# Patient Record
Sex: Male | Born: 1980 | Race: White | Hispanic: No | Smoking: Never smoker
Health system: Southern US, Community
[De-identification: ages and names within clinical notes are randomized; demographics above are authoritative.]

---

## 2015-01-21 ENCOUNTER — Ambulatory Visit (INDEPENDENT_AMBULATORY_CARE_PROVIDER_SITE_OTHER): Payer: PRIVATE HEALTH INSURANCE | Admitting: Family Medicine

## 2015-01-21 ENCOUNTER — Encounter: Payer: Self-pay | Admitting: Family Medicine

## 2015-01-21 VITALS — BP 110/80 | Ht 72.0 in | Wt 175.0 lb

## 2015-01-21 DIAGNOSIS — M76821 Posterior tibial tendinitis, right leg: Secondary | ICD-10-CM | POA: Diagnosis not present

## 2015-01-21 DIAGNOSIS — M25512 Pain in left shoulder: Secondary | ICD-10-CM

## 2015-01-21 DIAGNOSIS — G8929 Other chronic pain: Secondary | ICD-10-CM | POA: Diagnosis not present

## 2015-01-21 DIAGNOSIS — M76829 Posterior tibial tendinitis, unspecified leg: Secondary | ICD-10-CM | POA: Insufficient documentation

## 2015-01-21 NOTE — Assessment & Plan Note (Signed)
Vague anterior pain that does not awaken him at night. Positive labral testing and history of this as well. -Concern for underlying labral issue. -Discussed merits of repeat MRA which is 88% sensitive versus only 75% sensitive for MRI. He is considered operative management this time which feel this would be a waste of money and time. -Recommend doing rotator cuff strengthening and avoiding overhead exercises/eccentric loading of the biceps. If this does not work we can try an intra-articular injection versus eventually doing a MRA if he is willing to have surgery.
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
## 2015-01-21 NOTE — Assessment & Plan Note (Signed)
Secondary to osteophyte off the medial malleolus with compression in that region. Discussed possible options including activity modification, orthotics, medial heel wedge, nitroglycerin patch, arch supports, topical versus systemic NSAIDs, compression. -We'll start with compression and can progress. He'll let us know he does.

## 2015-04-06 ENCOUNTER — Telehealth: Payer: Self-pay | Admitting: Family Medicine

## 2015-04-06 ENCOUNTER — Other Ambulatory Visit: Payer: Self-pay | Admitting: *Deleted

## 2015-04-06 DIAGNOSIS — G8929 Other chronic pain: Secondary | ICD-10-CM

## 2015-04-06 DIAGNOSIS — M25512 Pain in left shoulder: Principal | ICD-10-CM

## 2015-04-06 NOTE — Telephone Encounter (Signed)
I discussed with the pt about his ongoing L shoulder dilemma.  He has having severe disabling L shoulder pain preventing him from performing ADL's at this point and is waking him up at night.  Due to the ongoing shoulder pain and severity, we will proceed with an MRA/MRI of the L shoulder to evaluate possible etiologies.  Conservative management to this point including physical therapy and medications has been recalcitrant.    Thanks,  Twana First. [facility] sports medicine fellow

## 2015-04-06 NOTE — Addendum Note (Signed)
Addended by: Annita Brod on: 04/06/2015 10:21 AM   Modules accepted: Orders

## 2015-04-07 NOTE — Telephone Encounter (Signed)
MRI approval number is [id number]

## 2015-04-23 ENCOUNTER — Ambulatory Visit
Admission: RE | Admit: 2015-04-23 | Discharge: 2015-04-23 | Disposition: A | Discharge status: Home / Self Care | Payer: PRIVATE HEALTH INSURANCE | Source: Ambulatory Visit | Attending: Family Medicine | Admitting: Family Medicine

## 2015-04-23 DIAGNOSIS — M25512 Pain in left shoulder: Principal | ICD-10-CM

## 2015-04-23 DIAGNOSIS — G8929 Other chronic pain: Secondary | ICD-10-CM

## 2015-04-23 MED ORDER — IOHEXOL 180 MG/ML  SOLN
10.0000 mL | Freq: Once | INTRAMUSCULAR | Status: AC | PRN
  Administered 2015-04-23: 10 mL via INTRA_ARTICULAR

## 2015-04-25 ENCOUNTER — Telehealth: Payer: Self-pay | Admitting: Family Medicine

## 2015-04-25 NOTE — Telephone Encounter (Signed)
Discussed results with pt.  Will refer to Dr. Dion SaucierLandau for discussion of possible PT/injection vs surgical debridement with biceps tenodesis.  Thanks, Twana FirstBryan R. Mileidy Atkin, DO of [facility]   04/25/2015, 12:18 PM

## 2015-04-30 ENCOUNTER — Other Ambulatory Visit: Payer: Self-pay | Admitting: *Deleted

## 2015-04-30 DIAGNOSIS — G8929 Other chronic pain: Secondary | ICD-10-CM

## 2015-04-30 DIAGNOSIS — M25512 Pain in left shoulder: Principal | ICD-10-CM

## 2015-07-27 ENCOUNTER — Ambulatory Visit (INDEPENDENT_AMBULATORY_CARE_PROVIDER_SITE_OTHER): Payer: PRIVATE HEALTH INSURANCE | Admitting: Sports Medicine

## 2015-07-27 ENCOUNTER — Encounter: Payer: Self-pay | Admitting: Sports Medicine

## 2015-07-27 VITALS — BP 116/76 | HR 58 | Resp 16 | Wt 183.0 lb

## 2015-07-27 DIAGNOSIS — M5412 Radiculopathy, cervical region: Secondary | ICD-10-CM

## 2015-07-27 DIAGNOSIS — G8929 Other chronic pain: Secondary | ICD-10-CM | POA: Diagnosis not present

## 2015-07-27 DIAGNOSIS — Z0000 Encounter for general adult medical examination without abnormal findings: Secondary | ICD-10-CM | POA: Insufficient documentation

## 2015-07-27 DIAGNOSIS — M25512 Pain in left shoulder: Secondary | ICD-10-CM

## 2015-07-27 DIAGNOSIS — Z23 Encounter for immunization: Secondary | ICD-10-CM

## 2015-07-27 LAB — CBC
HCT: 45.6 % (ref 38.5–50.0)
Hemoglobin: 15.4 g/dL (ref 13.2–17.1)
MCH: 29.8 pg (ref 27.0–33.0)
MCHC: 33.8 g/dL (ref 32.0–36.0)
MCV: 88.2 fL (ref 80.0–100.0)
MPV: 11.4 fL (ref 7.5–12.5)
Platelets: 188 K/uL (ref 140–400)
RBC: 5.17 MIL/uL (ref 4.20–5.80)
RDW: 13.6 % (ref 11.0–15.0)
WBC: 5.8 K/uL (ref 3.8–10.8)

## 2015-07-27 MED ORDER — MELOXICAM 15 MG PO TABS: ORAL_TABLET | ORAL | Status: AC
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
## 2015-07-27 NOTE — Assessment & Plan Note (Signed)
Checking routine blood work. Tdap given today.

## 2015-07-27 NOTE — Assessment & Plan Note (Signed)
Left shoulder pain is multifactorial, left posterior trapezial pain is likely radicular, we have discussed her cannot mix, he will raise his computer screen. Adding physical therapy and traction for his neck as well as meloxicam. He's already had x-rays done but we will do one to 2 months of conservative care before proceeding with cervical MRI. Certainly gabapentin and amitriptyline are options.

## 2015-07-27 NOTE — Assessment & Plan Note (Signed)
This is post arthroscopic repair. Persistent pain, with mechanical symptoms, as well as impingement signs. Aggressive formal physical therapy, switching to meloxicam. He does have an [facility], he will provide me with the disc for review.

## 2015-07-28 LAB — COMPREHENSIVE METABOLIC PANEL
ALT: 23 U/L (ref 9–46)
AST: 20 U/L (ref 10–40)
Albumin: 4.3 g/dL (ref 3.6–5.1)
Alkaline Phosphatase: 57 U/L (ref 40–115)
Chloride: 103 mmol/L (ref 98–110)
Creat: 0.92 mg/dL (ref 0.60–1.35)
Potassium: 5 mmol/L (ref 3.5–5.3)
Sodium: 139 mmol/L (ref 135–146)
Total Protein: 7.2 g/dL (ref 6.1–8.1)

## 2015-07-28 LAB — LIPID PANEL
Cholesterol: 188 mg/dL (ref 125–200)
HDL: 57 mg/dL (ref >=40)
LDL Cholesterol: 92 mg/dL (ref <130)
Total CHOL/HDL Ratio: 3.3 ratio (ref <=5.0)
Triglycerides: 194 mg/dL — ABNORMAL HIGH (ref <150)
VLDL: 39 mg/dL — ABNORMAL HIGH (ref <30)

## 2015-07-28 LAB — VITAMIN D 25 HYDROXY (VIT D DEFICIENCY, FRACTURES): Vit D, 25-Hydroxy: 10 ng/mL — ABNORMAL LOW (ref 30–100)

## 2015-07-28 LAB — COMPREHENSIVE METABOLIC PANEL WITH GFR
BUN: 14 mg/dL (ref 7–25)
CO2: 28 mmol/L (ref 20–31)
Calcium: 9.6 mg/dL (ref 8.6–10.3)
Glucose, Bld: 96 mg/dL (ref 65–99)
Total Bilirubin: 1.8 mg/dL — ABNORMAL HIGH (ref 0.2–1.2)

## 2015-07-28 LAB — HEMOGLOBIN A1C
Hgb A1c MFr Bld: 5.6 % (ref <5.7)
Mean Plasma Glucose: 114 mg/dL

## 2015-07-28 LAB — HIV ANTIBODY (ROUTINE TESTING W REFLEX): HIV 1&2 Ab, 4th Generation: NONREACTIVE

## 2015-07-28 LAB — TSH: TSH: 0.93 mIU/L (ref 0.40–4.50)

## 2015-07-28 MED ORDER — VITAMIN D (ERGOCALCIFEROL) 1.25 MG (50000 UNIT) PO CAPS: 50000.0000 [IU] | ORAL_CAPSULE | ORAL | Status: AC

## 2015-07-28 NOTE — Addendum Note (Signed)
 Addended by: Monica BectonHEKKEKANDAM,  J on: 07/28/2015 09:04 AM   Modules accepted: Orders, Medications

## 2015-08-28 ENCOUNTER — Encounter: Payer: Self-pay | Admitting: Sports Medicine

## 2015-08-28 ENCOUNTER — Ambulatory Visit (INDEPENDENT_AMBULATORY_CARE_PROVIDER_SITE_OTHER): Payer: PRIVATE HEALTH INSURANCE | Admitting: Sports Medicine

## 2015-08-28 VITALS — BP 108/63 | HR 64 | Resp 16 | Wt 184.3 lb

## 2015-08-28 DIAGNOSIS — E781 Pure hyperglyceridemia: Secondary | ICD-10-CM | POA: Diagnosis not present

## 2015-08-28 DIAGNOSIS — M25512 Pain in left shoulder: Secondary | ICD-10-CM | POA: Diagnosis not present

## 2015-08-28 DIAGNOSIS — L649 Androgenic alopecia, unspecified: Secondary | ICD-10-CM | POA: Diagnosis not present

## 2015-08-28 DIAGNOSIS — M5412 Radiculopathy, cervical region: Secondary | ICD-10-CM | POA: Diagnosis not present

## 2015-08-28 DIAGNOSIS — G8929 Other chronic pain: Secondary | ICD-10-CM

## 2015-08-28 MED ORDER — MINOXIDIL 5 % EX FOAM: CUTANEOUS | Status: AC

## 2015-08-28 MED ORDER — ICOSAPENT ETHYL 1 G PO CAPS: 1.0000 | ORAL_CAPSULE | Freq: Two times a day (BID) | ORAL | Status: AC

## 2015-08-28 MED ORDER — OMEGA-3-ACID ETHYL ESTERS 1 G PO CAPS: 2.0000 g | ORAL_CAPSULE | Freq: Two times a day (BID) | ORAL | Status: AC

## 2015-08-28 NOTE — Assessment & Plan Note (Signed)
Essentially resolved and well controlled with meloxicam 

## 2015-08-28 NOTE — Progress Notes (Signed)
  Subjective:    CC: Follow-up  HPI: Left cervical radiculopathy: Improved significantly with rehabilitation exercises and meloxicam to the point where he is essentially pain-free and functional.  SLAP tear left shoulder: Also doing extremely well, meloxicam makes his pain near imperceptible.  Hypertriglyceridemia: He is a vegetarian, and his agreeable to proceed with treatment, does not have a heavy intake of dietary fat.  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  Past medical history, Surgical history, Family history not pertinant except as noted below, Social history, Allergies, and medications have been entered into the medical record, reviewed, and no changes needed.   Review of Systems: No fevers, chills, night sweats, weight loss, chest pain, or shortness of breath.   Objective:    General: Well Developed, well nourished, and in no acute distress.  Neuro: Alert and oriented x3, extra-ocular muscles intact, sensation grossly intact.  HEENT: Normocephalic, atraumatic, pupils equal round reactive to light, neck supple, no masses, no lymphadenopathy, thyroid nonpalpable. There is only minimal male pattern baldness Skin: Warm and dry, no rashes. Cardiac: Regular rate and rhythm, no murmurs rubs or gallops, no lower extremity edema.  Respiratory: Clear to auscultation bilaterally. Not using accessory muscles, speaking in full sentences.  Impression and Recommendations:    I spent 25 minutes with this patient, greater than 50% was face-to-face time counseling regarding the above diagnoses

## 2015-08-28 NOTE — Assessment & Plan Note (Signed)
Essentially resolved and well controlled with meloxicam

## 2015-08-28 NOTE — Assessment & Plan Note (Signed)
Adding Vascepa.

## 2015-08-28 NOTE — Assessment & Plan Note (Signed)
Minoxidil, holding off on finasteride for now.

## 2017-08-04 IMAGING — MR MR SHOULDER*L* W/ CM
6 series · 40 of 40 positions shown · IV contrast (agent unspecified)
Comparison: None.

CLINICAL DATA: Left shoulder pain for 1 year. History of prior
shoulder surgery 7332.

EXAM:
MR ARTHROGRAM OF THE left SHOULDER
TECHNIQUE: Multiplanar, multisequence MR imaging of the left shoulder was
performed following the administration of intra-articular contrast.
CONTRAST:  See Injection Documentation.

[Series 3: T1 fat-sat · axial · 4.0mm · 0.23mm/px · z∈[-36,+42]mm · 8 of 18 slices shown (1 of 4)]
[im 1/18]
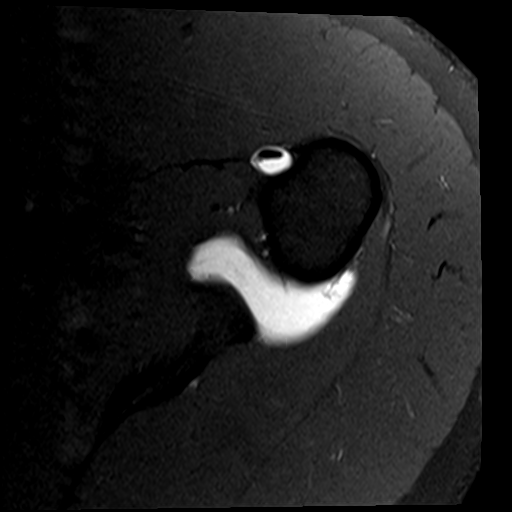
[im 3/18]
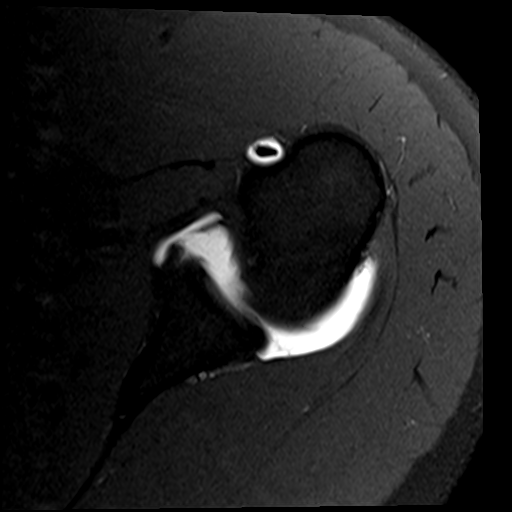
[im 5/18]
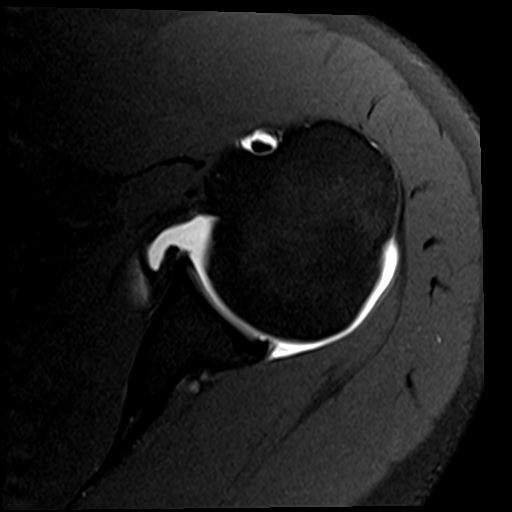
[im 8/18]
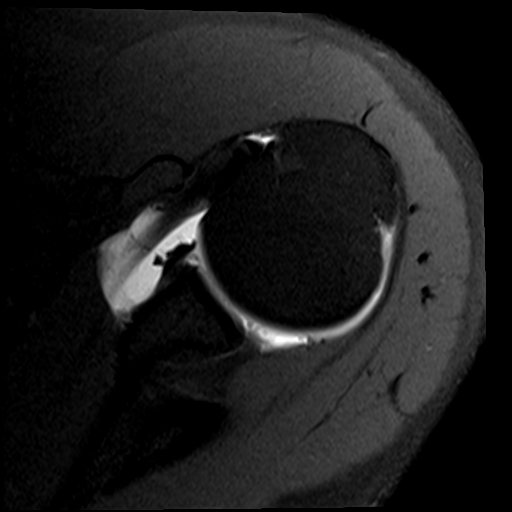
[im 10/18]
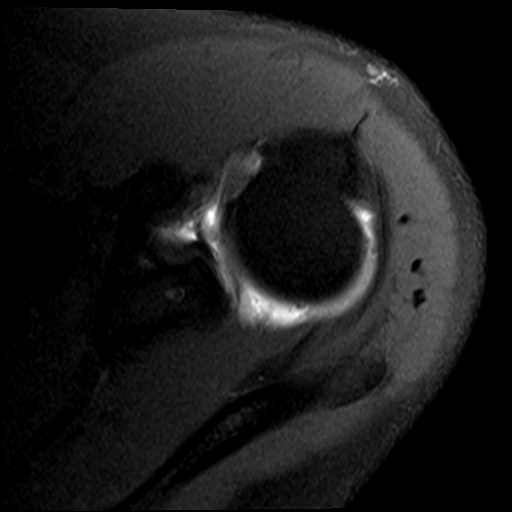
[im 13/18]
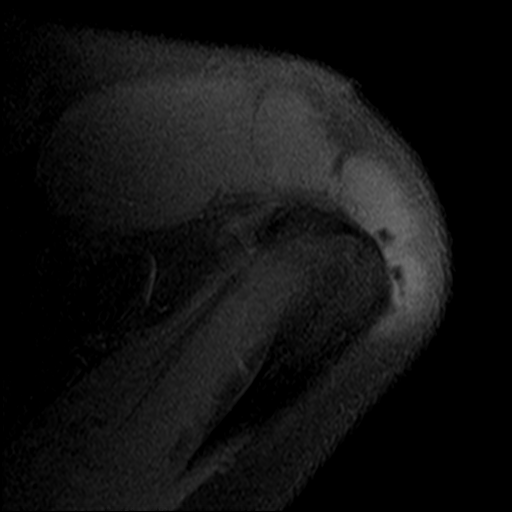
[im 15/18]
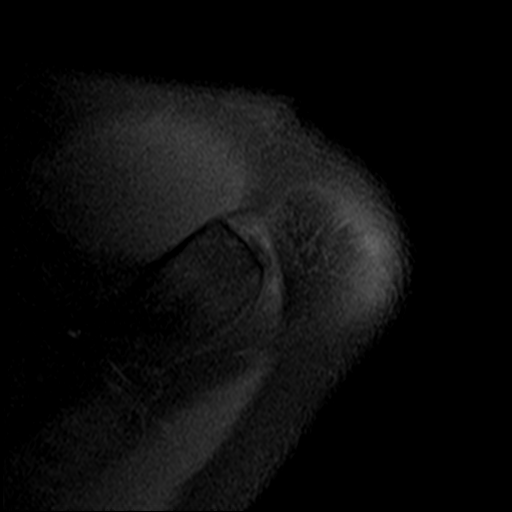
[im 18/18]
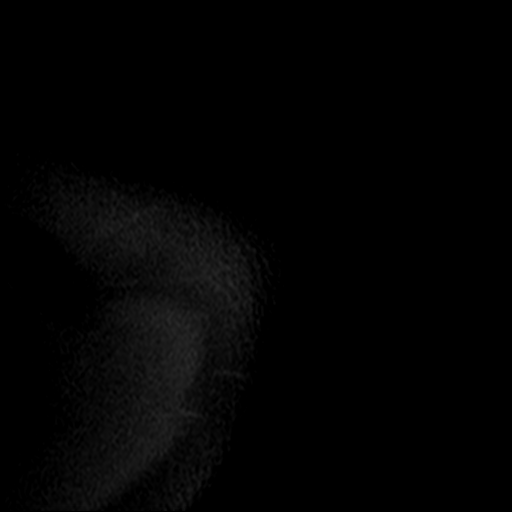

[Series 4: T2 fat-sat · oblique · 4.0mm · 0.55mm/px · 8 of 18 slices shown (1 of 2)]
[im 1/18]
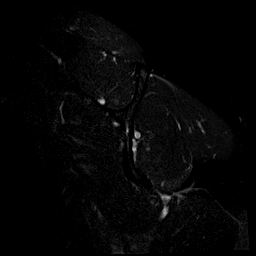
[im 3/18]
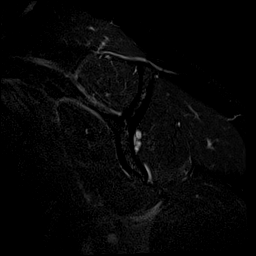
[im 5/18]
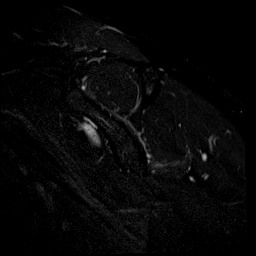
[im 8/18]
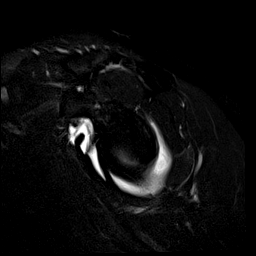
[im 10/18]
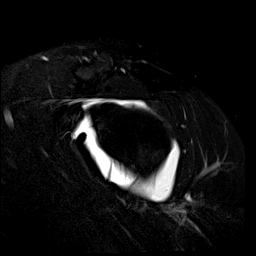
[im 13/18]
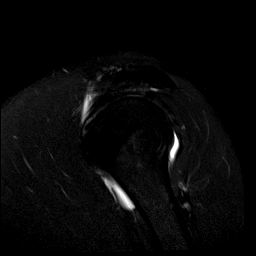
[im 15/18]
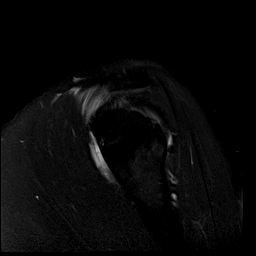
[im 18/18]
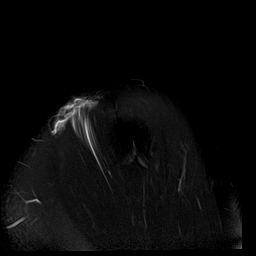

[Series 5: T1 fat-sat · sagittal · 4.0mm · 0.44mm/px · 6 of 16 slices shown (2 of 4)]
[im 1/16]
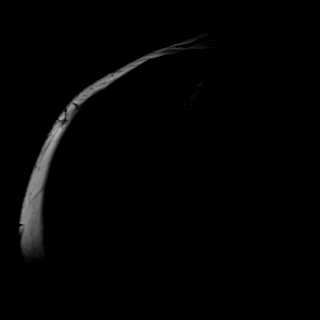
[im 4/16]
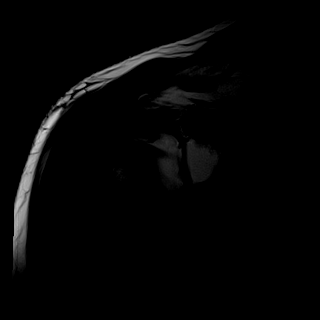
[im 7/16]
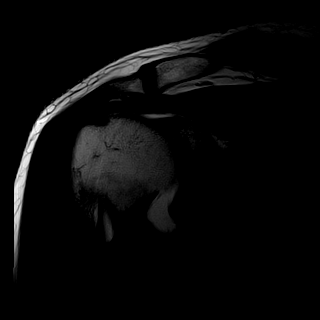
[im 10/16]
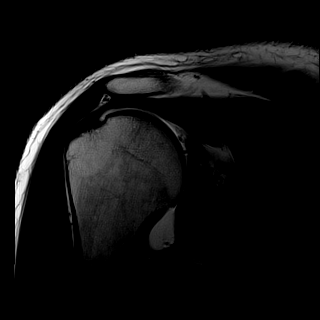
[im 13/16]
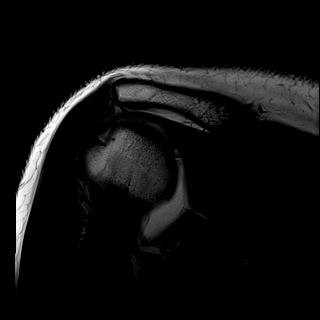
[im 16/16]
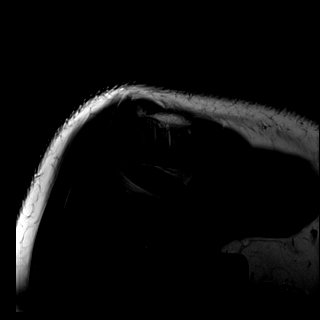

[Series 6: T1 fat-sat · sagittal · 4.0mm · 0.55mm/px · 6 of 16 slices shown (3 of 4)]
[im 1/16]
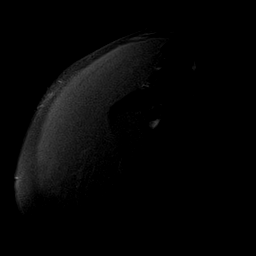
[im 4/16]
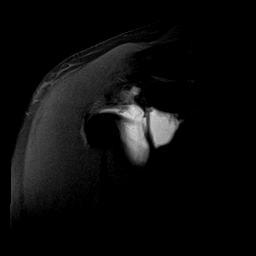
[im 7/16]
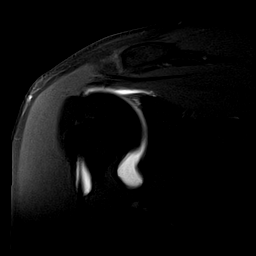
[im 10/16]
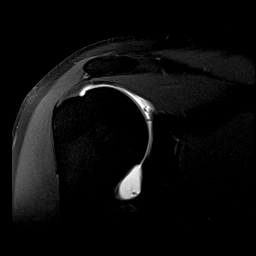
[im 13/16]
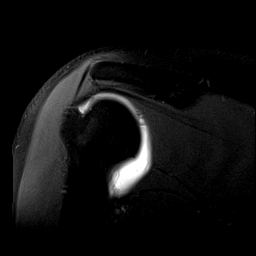
[im 16/16]
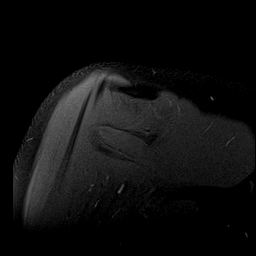

[Series 7: T2 fat-sat · sagittal · 4.0mm · 0.55mm/px · 6 of 16 slices shown (2 of 2)]
[im 1/16]
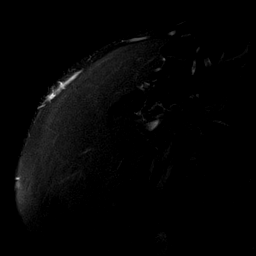
[im 4/16]
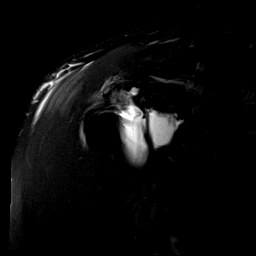
[im 7/16]
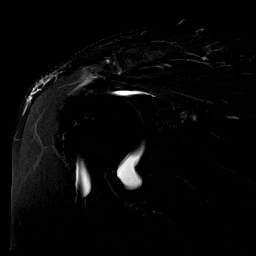
[im 10/16]
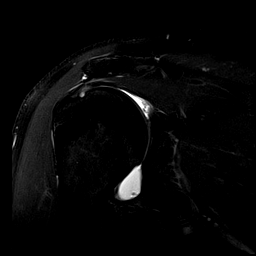
[im 13/16]
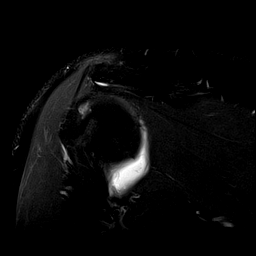
[im 16/16]
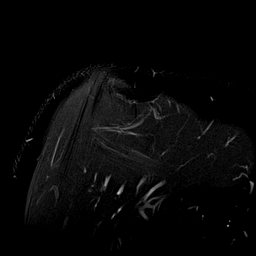

[Series 10: T1 fat-sat · sagittal · 4.0mm · 0.59mm/px · 6 of 16 slices shown (4 of 4)]
[im 1/16]
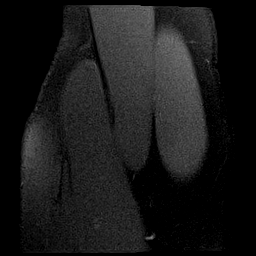
[im 4/16]
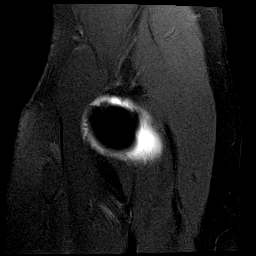
[im 7/16]
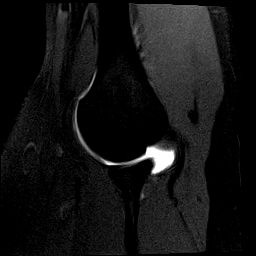
[im 10/16]
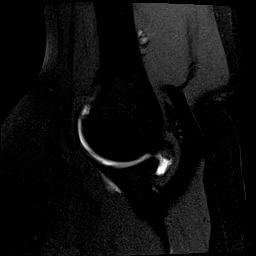
[im 13/16]
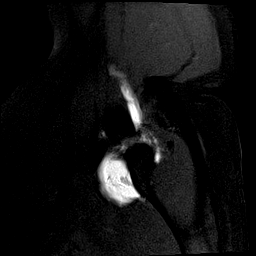
[im 16/16]
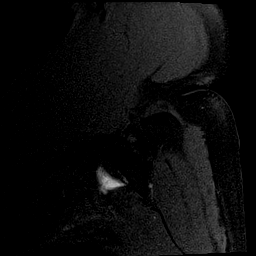

[40 of 40 positions shown; findings below may reference images not displayed]

FINDINGS: Rotator cuff: Moderate rotator cuff tendinopathy/tendinosis mainly
involving the infraspinatus and supraspinatus tendons.
Intrasubstance tears and probable shallow articular surface tears
but no full thickness retracted tear.

Muscles: Normal.

Biceps long head: Intact.

Acromioclavicular Joint: No significant degenerative changes. The
acromion is type 1-2 in shape.

Glenohumeral Joint: Mild glenohumeral joint degenerative changes.

Labrum: There is contrast within the superior labrum. This is most
likely a SLAP tear. This could be a surgical defect but recommend
correlation with previous surgical procedure. The anterior and
posterior labrum are somewhat degenerated and may have been
previously partially debrided. No obvious tear. The glenohumeral
ligaments are intact. The anterior labrocapsular complex is intact
on the ABER view.

Bones: No acute bony findings.
IMPRESSION: 1. Findings suspicious for a SLAP tear. This could be a surgical
defect but recommend correlation with prior surgical procedure.
2. Degenerative changes and possible debridement changes involving
the anterior and posterior labrum but no definite tear.
3. The long head biceps tendon, glenohumeral ligaments and anterior
labrocapsular complex are intact.
4. Moderate rotator cuff tendinopathy/tendinosis. No full-thickness
tear.
5. No significant findings for bony impingement.

## 2021-03-02 ENCOUNTER — Other Ambulatory Visit (HOSPITAL_BASED_OUTPATIENT_CLINIC_OR_DEPARTMENT_OTHER): Payer: Self-pay

## 2021-03-02 ENCOUNTER — Ambulatory Visit: Payer: PRIVATE HEALTH INSURANCE | Attending: Internal Medicine

## 2021-03-02 DIAGNOSIS — Z23 Encounter for immunization: Secondary | ICD-10-CM

## 2021-03-02 MED ORDER — PFIZER COVID-19 VAC BIVALENT 30 MCG/0.3ML IM SUSP
INTRAMUSCULAR | 0 refills | Status: AC
  Filled 2021-03-02: qty 0.3, 1d supply, fill #0

## 2021-03-03 NOTE — Progress Notes (Signed)
° °  Covid-19 Vaccination Clinic  Name:  Jordan Vines, MD    MRN: [id number] DOB: January 09, 1981  03/03/2021  Jordan Blake was observed post Covid-19 immunization for 15 minutes without incident. He was provided with Vaccine Information Sheet and instruction to access the V-Safe system.   Jordan Blake was instructed to call 911 with any severe reactions post vaccine: Difficulty breathing  Swelling of face and throat  A fast heartbeat  A bad rash all over body  Dizziness and weakness   Immunizations Administered     Name Date Dose VIS Date Route   Pfizer Covid-19 Vaccine Bivalent Booster 03/02/2021  4:24 PM 0.3 mL 10/21/2020 Intramuscular   Manufacturer: [employer], [employer]   Lot: [id number]   NDC: [phone]
